# Patient Record
Sex: Female | Born: 1955
Health system: Southern US, Community
[De-identification: ages and names within clinical notes are randomized; demographics above are authoritative.]

## PROBLEM LIST (undated history)

## (undated) DIAGNOSIS — K219 Gastro-esophageal reflux disease without esophagitis: Secondary | ICD-10-CM

## (undated) DIAGNOSIS — S83209A Unspecified tear of unspecified meniscus, current injury, unspecified knee, initial encounter: Secondary | ICD-10-CM

## (undated) DIAGNOSIS — E785 Hyperlipidemia, unspecified: Secondary | ICD-10-CM

## (undated) DIAGNOSIS — M25569 Pain in unspecified knee: Secondary | ICD-10-CM

## (undated) DIAGNOSIS — R739 Hyperglycemia, unspecified: Secondary | ICD-10-CM

## (undated) DIAGNOSIS — E559 Vitamin D deficiency, unspecified: Secondary | ICD-10-CM

## (undated) DIAGNOSIS — I129 Hypertensive chronic kidney disease with stage 1 through stage 4 chronic kidney disease, or unspecified chronic kidney disease: Secondary | ICD-10-CM

## (undated) DIAGNOSIS — D219 Benign neoplasm of connective and other soft tissue, unspecified: Secondary | ICD-10-CM

## (undated) DIAGNOSIS — E7800 Pure hypercholesterolemia, unspecified: Secondary | ICD-10-CM

## (undated) DIAGNOSIS — D509 Iron deficiency anemia, unspecified: Secondary | ICD-10-CM

## (undated) DIAGNOSIS — E1121 Type 2 diabetes mellitus with diabetic nephropathy: Secondary | ICD-10-CM

## (undated) DIAGNOSIS — E2839 Other primary ovarian failure: Secondary | ICD-10-CM

## (undated) DIAGNOSIS — N181 Chronic kidney disease, stage 1: Secondary | ICD-10-CM

## (undated) DIAGNOSIS — H8110 Benign paroxysmal vertigo, unspecified ear: Secondary | ICD-10-CM

## (undated) HISTORY — PX: HERNIA REPAIR: SHX51

## (undated) HISTORY — DX: Hyperglycemia, unspecified: R73.9

## (undated) HISTORY — DX: Type 2 diabetes mellitus with diabetic nephropathy: E11.21

## (undated) HISTORY — DX: Benign neoplasm of connective and other soft tissue, unspecified: D21.9

## (undated) HISTORY — DX: Hyperlipidemia, unspecified: E78.5

## (undated) HISTORY — DX: Chronic kidney disease, stage 1: N18.1

## (undated) HISTORY — DX: Other primary ovarian failure: E28.39

## (undated) HISTORY — DX: Benign paroxysmal vertigo, unspecified ear: H81.10

## (undated) HISTORY — DX: Iron deficiency anemia, unspecified: D50.9

## (undated) HISTORY — DX: Vitamin D deficiency, unspecified: E55.9

## (undated) HISTORY — DX: Pure hypercholesterolemia, unspecified: E78.00

## (undated) HISTORY — DX: Unspecified tear of unspecified meniscus, current injury, unspecified knee, initial encounter: S83.209A

## (undated) HISTORY — DX: Gastro-esophageal reflux disease without esophagitis: K21.9

## (undated) HISTORY — DX: Hypertensive chronic kidney disease with stage 1 through stage 4 chronic kidney disease, or unspecified chronic kidney disease: I12.9

---

## 1998-07-03 ENCOUNTER — Emergency Department (HOSPITAL_COMMUNITY): Admission: EM | Admit: 1998-07-03 | Discharge: 1998-07-03 | Payer: Self-pay | Admitting: Emergency Medicine

## 1998-07-29 ENCOUNTER — Ambulatory Visit (HOSPITAL_COMMUNITY): Admission: RE | Admit: 1998-07-29 | Discharge: 1998-07-29 | Payer: Self-pay | Admitting: Family Medicine

## 1999-11-10 ENCOUNTER — Encounter: Payer: Self-pay | Admitting: Family Medicine

## 1999-11-10 ENCOUNTER — Encounter: Admission: RE | Admit: 1999-11-10 | Discharge: 1999-11-10 | Payer: Self-pay | Admitting: Family Medicine

## 2000-02-08 ENCOUNTER — Emergency Department (HOSPITAL_COMMUNITY): Admission: EM | Admit: 2000-02-08 | Discharge: 2000-02-09 | Payer: Self-pay | Admitting: Emergency Medicine

## 2000-02-08 ENCOUNTER — Encounter: Payer: Self-pay | Admitting: Emergency Medicine

## 2000-12-05 ENCOUNTER — Other Ambulatory Visit: Admission: RE | Admit: 2000-12-05 | Discharge: 2000-12-05 | Payer: Self-pay | Admitting: Obstetrics & Gynecology

## 2002-07-15 ENCOUNTER — Encounter: Payer: Self-pay | Admitting: Family Medicine

## 2002-07-15 ENCOUNTER — Ambulatory Visit (HOSPITAL_COMMUNITY): Admission: RE | Admit: 2002-07-15 | Discharge: 2002-07-15 | Payer: Self-pay | Admitting: Family Medicine

## 2002-09-30 ENCOUNTER — Encounter: Payer: Self-pay | Admitting: General Surgery

## 2002-10-02 ENCOUNTER — Ambulatory Visit (HOSPITAL_COMMUNITY): Admission: RE | Admit: 2002-10-02 | Discharge: 2002-10-04 | Payer: Self-pay | Admitting: General Surgery

## 2003-05-17 ENCOUNTER — Other Ambulatory Visit: Admission: RE | Admit: 2003-05-17 | Discharge: 2003-05-17 | Payer: Self-pay | Admitting: Family Medicine

## 2005-05-17 ENCOUNTER — Other Ambulatory Visit: Admission: RE | Admit: 2005-05-17 | Discharge: 2005-05-17 | Payer: Self-pay | Admitting: Obstetrics and Gynecology

## 2006-04-16 ENCOUNTER — Inpatient Hospital Stay (HOSPITAL_COMMUNITY): Admission: EM | Admit: 2006-04-16 | Discharge: 2006-04-17 | Payer: Self-pay | Admitting: Emergency Medicine

## 2007-03-03 ENCOUNTER — Other Ambulatory Visit: Admission: RE | Admit: 2007-03-03 | Discharge: 2007-03-03 | Payer: Self-pay | Admitting: Obstetrics and Gynecology

## 2008-08-20 ENCOUNTER — Encounter: Admission: RE | Admit: 2008-08-20 | Discharge: 2008-08-20 | Payer: Self-pay | Admitting: Family Medicine

## 2009-09-12 ENCOUNTER — Encounter: Admission: RE | Admit: 2009-09-12 | Discharge: 2009-09-12 | Payer: Self-pay | Admitting: Family Medicine

## 2009-09-12 ENCOUNTER — Other Ambulatory Visit: Admission: RE | Admit: 2009-09-12 | Discharge: 2009-09-12 | Payer: Self-pay | Admitting: Family Medicine

## 2010-05-28 ENCOUNTER — Encounter: Payer: Self-pay | Admitting: Family Medicine

## 2010-08-25 ENCOUNTER — Ambulatory Visit
Admission: RE | Admit: 2010-08-25 | Discharge: 2010-08-25 | Disposition: A | Discharge status: Home / Self Care | Payer: Self-pay | Source: Ambulatory Visit | Attending: Family Medicine | Admitting: Family Medicine

## 2010-08-25 ENCOUNTER — Other Ambulatory Visit: Payer: Self-pay | Admitting: Family Medicine

## 2010-08-25 DIAGNOSIS — M542 Cervicalgia: Secondary | ICD-10-CM

## 2010-09-22 NOTE — Op Note (Signed)
NAME:  Margaret Wong, Margaret Wong                       ACCOUNT NO.:  [id number]   MEDICAL RECORD NO.:  [id number]                   PATIENT TYPE:  OIB   LOCATION:  5735                                 FACILITY:  [facility]   PHYSICIAN:  Jimmye Norman III, M.D.               DATE OF BIRTH:  06/24/1955   DATE OF PROCEDURE:  10/02/2002  DATE OF DISCHARGE:                                 OPERATIVE REPORT   PREOPERATIVE DIAGNOSIS:  Paraumbilical ventral hernia.   POSTOPERATIVE DIAGNOSIS:  Incisional ventral hernia.   PROCEDURE:  Laparoscopic ventral hernia repair with dual mesh measuring 15 x  19 cm in size.   SURGEON:  Jimmye Norman, M.D.   ASSISTANT:  Sharlet Salina T. Hoxworth, M.D.   ANESTHESIA:  General endotracheal.   ESTIMATED BLOOD LOSS:  Less than 50 cc.   COMPLICATIONS:  None.   CONDITION:  Stable.   INDICATIONS FOR OPERATION:  The patient is a 55 year old with lower  abdominal umbilical hernia, apparent ventral hernia repair before, and now  she has recurrence.  No other history.  Now comes in with a ventral hernia.   FINDINGS:  The patient had omentum caught up in a ventral hernia along the  anterior abdominal wall and in the lower midline.  There was no bowel injury  or incarceration in the hernia.   OPERATION:  The patient was taken to the operating room and placed on the  table in the supine position.  After an adequate endotracheal anesthetic was  administered, she was prepped and draped in the usual sterile manner  exposing the entire abdomen from the costal margins down to the pubic crest.   The initial Hasson cannula was placed in the left lower quadrant of the  patient's abdomen away from the midline incision.  The umbilical fascia was  accessed using appendical retractors, incised, and then the subsequently  incised, allowing access into the peritoneal cavity with the cannula.  It  was secured in place with a pursestring suture of 0 Vicryl.  We insufflated  through this  cannula into the peritoneal cavity up until a maximal intra-  abdominal pressure of 15 mmHg.  We subsequently passed a 0-degree 10-mm  scope through the left lower quadrant cannula inspecting the area.  There  were adhesions close to the cannula itself and along the midline.  Therefore, we placed a right upper quadrant 5-mm cannula which was used to  access the abdomen and also a right lower quadrant 5-mm cannula away from  the midline.  We used a 5-mm scope in order to dissect out the omentum and  the adhesions to the anterior abdominal wall from the right side of the  patient's abdomen.  We used the scope through the right lower quadrant 5-mm  cannula with the laparoscope in that area.  We were able to dissect off the  omentum from the ventral hernia itself and could completely detach its  attachment from the anterior abdominal wall using electrocautery, scissors,  and also blunt dissection.   Once we completely detached the omentum from the anterior abdominal wall, we  measured the edges for our mesh.  We went about 1 cm to 2 cm away from the  edge of the hernia defect and with localizing needles were able to mark the  spots for the mesh.  The measurements were approximately 15 x 15 cm in size  and, therefore, we used a 15 x 19-cm piece of dual mesh with the rough side  being up against the patient's abdominal wall.   We measured this, and then subsequently placed sutures of 0 Novofil in six  equally spaced places along the edge, passing it through the 10-mm cannula  into the peritoneal cavity, and opened it up in order to allow the sutures  to be seen.  The rough side again was up against the fascia and the  abdominal wall with the smooth side being against the peritoneal cavity  contents.  We marked the sites for the sutures to be brought through the  anterior abdominal wall.  We used a suture grasper which was brought out in  the six sites for removal.  We subsequently did that,  pulled up on the mesh,  secured it in place with suture tying down.  One of the lateral sutures did  break and subsequently fall away from the abdominal wall, but this was after  we had attached 75% of more of the mesh using a tacker to the anterior  abdominal wall.  We subsequently tacked the area where the suture had broken  on the right lateral aspect and secured it in place with these tacking  staplers.   We circumferentially tacked down the mesh, and it had good appositions at  the anterior abdominal wall.  We then allowed all gas to escape, and we  closed.   The site of the initial cannula was closed using the pursestring suture.  We  also added a figure-of-eight stitch of 0 Vicryl on the corner using a UR-6  needle.  We closed this area using a running subcuticular stitch of 4-0  Vicryl.  The rest of the sites were closed using Steri-Strips.  All needle  counts, sponge counts, and instrument counts were correct at the  conclusion  of the case.                                               Kathrin Ruddy, M.D.    JW/MEDQ  D:  10/02/2002  T:  10/03/2002  Job:  [id number]

## 2010-09-29 ENCOUNTER — Other Ambulatory Visit: Payer: Self-pay | Admitting: Obstetrics and Gynecology

## 2010-09-29 ENCOUNTER — Other Ambulatory Visit (HOSPITAL_COMMUNITY)
Admission: RE | Admit: 2010-09-29 | Discharge: 2010-09-29 | Disposition: A | Discharge status: Home / Self Care | Payer: Managed Care, Other (non HMO) | Source: Ambulatory Visit | Attending: Obstetrics and Gynecology | Admitting: Obstetrics and Gynecology

## 2010-09-29 DIAGNOSIS — Z01419 Encounter for gynecological examination (general) (routine) without abnormal findings: Secondary | ICD-10-CM | POA: Insufficient documentation

## 2010-11-28 ENCOUNTER — Ambulatory Visit (HOSPITAL_COMMUNITY)
Admission: RE | Admit: 2010-11-28 | Discharge: 2010-11-28 | Disposition: A | Discharge status: Home / Self Care | Payer: Managed Care, Other (non HMO) | Source: Ambulatory Visit | Attending: Sports Medicine | Admitting: Sports Medicine

## 2010-11-28 DIAGNOSIS — M79609 Pain in unspecified limb: Secondary | ICD-10-CM | POA: Insufficient documentation

## 2011-05-03 DIAGNOSIS — H9209 Otalgia, unspecified ear: Secondary | ICD-10-CM | POA: Insufficient documentation

## 2011-05-03 NOTE — ED Notes (Signed)
PT c/o L ear pain onset around noon.  Pt states she can still hear but it is muffled.  Pt states pain has increased over past few hours.  Pt hasn't taken any meds.

## 2011-05-04 ENCOUNTER — Emergency Department (HOSPITAL_BASED_OUTPATIENT_CLINIC_OR_DEPARTMENT_OTHER)
Admission: EM | Admit: 2011-05-04 | Discharge: 2011-05-04 | Discharge status: Against Medical Advice | Payer: Managed Care, Other (non HMO) | Attending: Emergency Medicine | Admitting: Emergency Medicine

## 2011-05-04 HISTORY — DX: Pain in unspecified knee: M25.569

## 2011-07-10 ENCOUNTER — Other Ambulatory Visit: Payer: Self-pay | Admitting: Family Medicine

## 2011-07-13 ENCOUNTER — Other Ambulatory Visit: Payer: Managed Care, Other (non HMO)

## 2011-07-16 ENCOUNTER — Ambulatory Visit
Admission: RE | Admit: 2011-07-16 | Discharge: 2011-07-16 | Disposition: A | Discharge status: Home / Self Care | Payer: Managed Care, Other (non HMO) | Source: Ambulatory Visit | Attending: Family Medicine | Admitting: Family Medicine

## 2011-07-16 MED ORDER — IOHEXOL 300 MG/ML  SOLN
100.0000 mL | Freq: Once | INTRAMUSCULAR | Status: AC | PRN
  Administered 2011-07-16: 100 mL via INTRAVENOUS

## 2012-02-15 ENCOUNTER — Other Ambulatory Visit: Payer: Self-pay | Admitting: Obstetrics and Gynecology

## 2012-02-15 ENCOUNTER — Other Ambulatory Visit (HOSPITAL_COMMUNITY)
Admission: RE | Admit: 2012-02-15 | Discharge: 2012-02-15 | Disposition: A | Discharge status: Home / Self Care | Payer: Managed Care, Other (non HMO) | Source: Ambulatory Visit | Attending: Obstetrics and Gynecology | Admitting: Obstetrics and Gynecology

## 2012-02-15 DIAGNOSIS — Z01419 Encounter for gynecological examination (general) (routine) without abnormal findings: Secondary | ICD-10-CM | POA: Insufficient documentation

## 2013-06-03 ENCOUNTER — Ambulatory Visit: Payer: Managed Care, Other (non HMO)

## 2013-06-10 ENCOUNTER — Encounter: Payer: Managed Care, Other (non HMO) | Attending: Family Medicine

## 2013-07-10 ENCOUNTER — Other Ambulatory Visit: Payer: Self-pay | Admitting: Family Medicine

## 2013-07-10 DIAGNOSIS — R1032 Left lower quadrant pain: Secondary | ICD-10-CM

## 2013-07-17 ENCOUNTER — Ambulatory Visit
Admission: RE | Admit: 2013-07-17 | Discharge: 2013-07-17 | Disposition: A | Discharge status: Home / Self Care | Payer: Managed Care, Other (non HMO) | Source: Ambulatory Visit | Attending: Family Medicine | Admitting: Family Medicine

## 2013-07-17 DIAGNOSIS — R1032 Left lower quadrant pain: Secondary | ICD-10-CM

## 2013-07-17 MED ORDER — IOHEXOL 300 MG/ML  SOLN
100.0000 mL | Freq: Once | INTRAMUSCULAR | Status: AC | PRN
Start: 2013-07-17 — End: 2013-07-17
  Administered 2013-07-17: 100 mL via INTRAVENOUS

## 2015-03-18 ENCOUNTER — Encounter: Payer: Self-pay | Admitting: *Deleted

## 2015-03-18 ENCOUNTER — Encounter: Payer: Self-pay | Admitting: Internal Medicine

## 2015-03-18 ENCOUNTER — Ambulatory Visit (INDEPENDENT_AMBULATORY_CARE_PROVIDER_SITE_OTHER): Payer: Managed Care, Other (non HMO) | Admitting: Internal Medicine

## 2015-03-18 VITALS — BP 122/76 | HR 80 | Ht 62.0 in | Wt 169.1 lb

## 2015-03-18 DIAGNOSIS — R079 Chest pain, unspecified: Secondary | ICD-10-CM | POA: Diagnosis not present

## 2015-03-18 NOTE — Progress Notes (Signed)
Cardiology Office Note   Date:  03/18/2015   ID:  Margaret Wong, DOB [birth year], MRN [id number]  PCP:  Vidal Schwalbe, MD  Cardiologist:   Dorris Carnes, MD   Chief Complaint  Patient presents with  . New Patient (Initial Visit)  . Chest Pain      History of Present Illness: Margaret Wong is a 59 y.o. female with a history of  CP   She was referred by Dr Dema Severin  No known history of CAD   She says she has episodes of CP that occur at night when lying in bed.  SOme SOB She also notes some chest pressure with walking at work  SOme SOB with exertion.  Does not exercise or walk outside of work Does note some problems with food getting stuck  Some reflex.         Current Outpatient Prescriptions  Medication Sig Dispense Refill  . atorvastatin (LIPITOR) 20 MG tablet Take 20 mg by mouth daily.  12  . ETODOLAC PO Take 1 tablet by mouth daily as needed. For pain     . losartan (COZAAR) 50 MG tablet Take 50 mg by mouth daily.  5  . meclizine (ANTIVERT) 25 MG tablet Take 25 mg by mouth daily.  5  . meloxicam (MOBIC) 15 MG tablet Take 15 mg by mouth daily.  5  . metFORMIN (GLUCOPHAGE-XR) 500 MG 24 hr tablet Take 500 mg by mouth daily.  5  . omeprazole (PRILOSEC) 40 MG capsule Take 40 mg by mouth daily.  5   No current facility-administered medications for this visit.    Allergies:   Amoxicillin   Past Medical History  Diagnosis Date  . Knee joint pain     Past Surgical History  Procedure Laterality Date  . Hernia repair       Social History:  The patient  reports that she has never smoked. She does not have any smokeless tobacco history on file. She reports that she does not drink alcohol or use illicit drugs.   Family History:  The patient's family history is not on file.    ROS:  Please see the history of present illness. All other systems are reviewed and  Negative to the above problem except as noted.    PHYSICAL EXAM: VS:  BP 122/76 mmHg  Pulse 80  Ht 5'  2" (1.575 m)  Wt 76.712 kg (169 lb 1.9 oz)  BMI 30.92 kg/m2  GEN: Obese 59 yo in no acute distress HEENT: normal Neck: no JVD, carotid bruits, or masses Cardiac: RRR; no murmurs, rubs, or gallops,no edema  Respiratory:  clear to auscultation bilaterally, normal work of breathing GI: soft, nontender, nondistended, + BS  No hepatomegaly  MS: no deformity Moving all extremities   Skin: warm and dry, no rash Neuro:  Strength and sensation are intact Psych: euthymic mood, full affect   EKG:  EKG is  ordered today.  SR 80 bpm     Lipid Panel No results found for: CHOL, TRIG, HDL, CHOLHDL, VLDL, LDLCALC, LDLDIRECT    Wt Readings from Last 3 Encounters:  03/18/15 76.712 kg (169 lb 1.9 oz)  05/03/11 74.844 kg (165 lb)      ASSESSMENT AND PLAN:  1  CP  SOme is atypical for cardiac  Sounds more GI in origin (spells in bed, difficulty swallowing food)  I do think she needs to be seen in GI  I would also set her upfor stress myvoview to evaluate  for ischemia  Consider echo if myoview normal to evaluate diastolic funciton.  2  HL  Keep on lipitor  Will need to be followed.     Signed, Dorris Carnes, MD  03/18/2015 3:12 PM    [address] [place], [place],   [address] Phone: [phone]; Fax: [phone]

## 2015-03-18 NOTE — Patient Instructions (Signed)
Medication Instructions:   NO CHANGE  Testing/Procedures:  Your physician has requested that you have en exercise stress myoview. For further information please visit [email or web address]. Please follow instruction sheet, as given.    Follow-Up:  Your physician recommends that you schedule a follow-up appointment in: AS NEEDED PENDING TEST RESULTS   If you need a refill on your cardiac medications before your next appointment, please call your pharmacy.

## 2015-04-13 ENCOUNTER — Telehealth (HOSPITAL_COMMUNITY): Payer: Self-pay

## 2015-04-13 NOTE — Telephone Encounter (Signed)
Left message on voicemail in reference to upcoming appointment scheduled for 04-15-2015. Phone number given for a call back so details instructions can be given. Irven Baltimore A,RN

## 2015-04-15 ENCOUNTER — Encounter (HOSPITAL_COMMUNITY): Payer: Managed Care, Other (non HMO)

## 2015-05-04 ENCOUNTER — Telehealth (HOSPITAL_COMMUNITY): Payer: Self-pay | Admitting: *Deleted

## 2015-05-04 NOTE — Telephone Encounter (Signed)
Left message on voicemail in reference to upcoming appointment scheduled for 05/06/15. Phone number given for a call back so details instructions can be given. Meloni Hinz, Ranae Palms

## 2015-05-06 ENCOUNTER — Ambulatory Visit (HOSPITAL_COMMUNITY): Payer: Managed Care, Other (non HMO)

## 2017-06-14 ENCOUNTER — Ambulatory Visit
Admission: RE | Admit: 2017-06-14 | Discharge: 2017-06-14 | Disposition: A | Discharge status: Home / Self Care | Payer: 59 | Source: Ambulatory Visit | Attending: Family Medicine | Admitting: Family Medicine

## 2017-06-14 ENCOUNTER — Other Ambulatory Visit: Payer: Self-pay | Admitting: Family Medicine

## 2017-06-14 DIAGNOSIS — R059 Cough, unspecified: Secondary | ICD-10-CM

## 2017-06-14 DIAGNOSIS — R05 Cough: Secondary | ICD-10-CM

## 2017-09-13 ENCOUNTER — Other Ambulatory Visit (HOSPITAL_COMMUNITY)
Admission: RE | Admit: 2017-09-13 | Discharge: 2017-09-13 | Disposition: A | Discharge status: Home / Self Care | Payer: Managed Care, Other (non HMO) | Source: Ambulatory Visit | Attending: Family Medicine | Admitting: Family Medicine

## 2017-09-13 ENCOUNTER — Other Ambulatory Visit: Payer: Self-pay | Admitting: Family Medicine

## 2017-09-13 DIAGNOSIS — Z124 Encounter for screening for malignant neoplasm of cervix: Secondary | ICD-10-CM | POA: Diagnosis present

## 2017-09-17 LAB — CYTOLOGY - PAP
Diagnosis: NEGATIVE
HPV: NOT DETECTED

## 2017-09-25 ENCOUNTER — Telehealth: Payer: Self-pay

## 2017-09-25 NOTE — Telephone Encounter (Signed)
SENT REFERRAL TO SCHEDULING 

## 2017-09-26 ENCOUNTER — Other Ambulatory Visit: Payer: Self-pay | Admitting: Family Medicine

## 2017-09-26 DIAGNOSIS — E2839 Other primary ovarian failure: Secondary | ICD-10-CM

## 2017-11-22 ENCOUNTER — Other Ambulatory Visit: Payer: Managed Care, Other (non HMO)

## 2018-01-31 ENCOUNTER — Other Ambulatory Visit: Payer: Managed Care, Other (non HMO)

## 2018-02-07 ENCOUNTER — Other Ambulatory Visit: Payer: Managed Care, Other (non HMO)

## 2018-04-07 ENCOUNTER — Other Ambulatory Visit: Payer: Managed Care, Other (non HMO)

## 2018-04-21 ENCOUNTER — Ambulatory Visit: Payer: Managed Care, Other (non HMO) | Admitting: Internal Medicine

## 2018-12-09 ENCOUNTER — Other Ambulatory Visit: Payer: Self-pay | Admitting: Family Medicine

## 2018-12-09 DIAGNOSIS — R5381 Other malaise: Secondary | ICD-10-CM

## 2019-03-06 ENCOUNTER — Other Ambulatory Visit: Payer: Self-pay

## 2019-03-06 ENCOUNTER — Encounter: Payer: Self-pay | Admitting: Internal Medicine

## 2019-03-06 ENCOUNTER — Ambulatory Visit (INDEPENDENT_AMBULATORY_CARE_PROVIDER_SITE_OTHER): Payer: 59 | Admitting: Internal Medicine

## 2019-03-06 VITALS — BP 140/82 | HR 75 | Ht 62.0 in | Wt 155.0 lb

## 2019-03-06 DIAGNOSIS — R0789 Other chest pain: Secondary | ICD-10-CM

## 2019-03-06 DIAGNOSIS — R0602 Shortness of breath: Secondary | ICD-10-CM

## 2019-03-06 DIAGNOSIS — R5383 Other fatigue: Secondary | ICD-10-CM

## 2019-03-06 MED ORDER — METOPROLOL TARTRATE 100 MG PO TABS: ORAL_TABLET | ORAL | 0 refills | Status: AC

## 2019-03-06 NOTE — Progress Notes (Signed)
Cardiology Office Note   Date:  03/06/2019   ID:  Margaret Wong, DOB [birth year], MRN [id number]  PCP:  Lois Huxley, PA  Cardiologist:   Dorris Carnes, MD   PT referred by PCP for eval of CP      History of Present Illness: Margaret Wong is a 63 y.o. female with a history of HL, HTN, DM   She is followed by Dr Dema Severin in IM   Saw Dr white in September     I saw the pt in 2016   [facility] had complaints of CP   More with laying in bed  The pt complains of CP that is worse when laying on one side.     She also notes however a tightness in chest with activity   ALso increased fatigue with activity   INcreased SOB with activity  Pt started back on Losartan and her symptoms have improved some   Still there  Pt anxious   Husband died in past year        Current Meds  Medication Sig   atorvastatin (LIPITOR) 20 MG tablet Take 20 mg by mouth daily.   Cholecalciferol (VITAMIN D3) 1.25 MG (50000 UT) CAPS Take by mouth.   ETODOLAC PO Take 1 tablet by mouth daily as needed. For pain    losartan (COZAAR) 50 MG tablet Take 50 mg by mouth daily.   meclizine (ANTIVERT) 25 MG tablet Take 25 mg by mouth daily.   metFORMIN (GLUCOPHAGE-XR) 500 MG 24 hr tablet Take 500 mg by mouth daily.   omeprazole (PRILOSEC) 40 MG capsule Take 40 mg by mouth daily.     Allergies:   Amoxicillin   Past Medical History:  Diagnosis Date   Acute torn meniscus of knee    BPV (benign positional vertigo)    CKD (chronic kidney disease) stage 1, GFR 90 ml/min or greater    Diabetes mellitus with nephropathy (HCC)    Dyslipidemia    Estrogen deficiency    Fibroids    GERD (gastroesophageal reflux disease)    Hypercholesterolemia    Hyperglycemia    Hypertension with renal disease    Iron deficiency anemia    Knee joint pain    Vitamin D deficiency     Past Surgical History:  Procedure Laterality Date   HERNIA REPAIR       Social History:  The patient  reports that she has never  smoked. She has never used smokeless tobacco. She reports that she does not drink alcohol or use drugs.   Family History:  The patient's family history includes Arthritis in her mother; Coronary artery disease in her father; Diabetes in her mother; Heart attack in her father; Hypertension in her father; Lung disease in her mother.    ROS:  Please see the history of present illness. All other systems are reviewed and  Negative to the above problem except as noted.    PHYSICAL EXAM: VS:  BP 140/82    Pulse 75    Ht 5\' 2"  (1.575 m)    Wt 155 lb (70.3 kg)    BMI 28.35 kg/m   GEN: Overweight 63 yo  in no acute distress  HEENT: normal  Neck: no JVD, carotid bruits, or masses Cardiac: RRR; no murmurs, rubs, or gallops,no edema  Respiratory:  clear to auscultation bilaterally, normal work of breathing GI: soft, nontender, nondistended, + BS  No hepatomegaly  MS: no deformity Moving all extremities   Skin: warm and  dry, no rash Neuro:  Strength and sensation are intact Psych: euthymic mood, full affect   EKG:  EKG is ordered today.  SR 74bpm     Lipid Panel No results found for: CHOL, TRIG, HDL, CHOLHDL, VLDL, LDLCALC, LDLDIRECT    Wt Readings from Last 3 Encounters:  03/06/19 155 lb (70.3 kg)  03/18/15 169 lb 1.9 oz (76.7 kg)  05/03/11 165 lb (74.8 kg)      ASSESSMENT AND PLAN:  1  Chest dyscomfort/  Pt's symptoms are confusing  She  Has some atypical L sided CP   But she has fatigue, tightness SOB  That is more concerning     I would reocmm coronary CT angiogram to evaluate   2  HL  BP is OK   WIll need to be followed  3   HL   Keep on lipitor   F/U based on test results   Current medicines are reviewed at length with the patient today.  The patient does not have concerns regarding medicines.  Signed, Dorris Carnes, MD  03/06/2019 2:16 PM    [facility] [place], [place],   [address] Phone: [phone]; Fax: [phone]

## 2019-03-06 NOTE — Patient Instructions (Addendum)
Medication Instructions:  No changes today *If you need a refill on your cardiac medications before your next appointment, please call your pharmacy*  Lab Work: Today: BMET If you have labs (blood work) drawn today and your tests are completely normal, you will receive your results only by: Marland Kitchen MyChart Message (if you have MyChart) OR . A paper copy in the mail If you have any lab test that is abnormal or we need to change your treatment, we will call you to review the results.  Testing/Procedures: Your physician has requested that you have cardiac CT. Cardiac computed tomography (CT) is a painless test that uses an x-ray machine to take clear, detailed pictures of your heart. For further information please visit HugeFiesta.[email or web address]. Please follow instruction sheet as given.    Follow-Up: At [facility], you and your health needs are our priority.  As part of our continuing mission to provide you with exceptional heart care, we have created designated Provider Care Teams.  These Care Teams include your primary Cardiologist (physician) and Advanced Practice Providers (APPs -  Physician Assistants and Nurse Practitioners) who all work together to provide you with the care you need, when you need it.  Your next appointment:   Follow up with your physician will depend on test results.   Other Instructions Your cardiac CT will be scheduled at one of the below locations:   [facility] [address] [place], [place] [address] [phone]  [facility] [address] [place], [place] [address] [phone]  If scheduled at [facility], please arrive at the [facility] main entrance of [facility] 30-45 minutes prior to test start time. Proceed to the [facility] Radiology Department (first floor) to check-in and test prep.  If scheduled at [facility], please arrive 15 mins early  for check-in and test prep.  Please follow these instructions carefully (unless otherwise directed):  On the Night Before the Test: . Be sure to Drink plenty of water. . Do not consume any caffeinated/decaffeinated beverages or chocolate 12 hours prior to your test. Do not take any antihistamines 12 hours prior to your test.  On the Day of the Test: . Drink plenty of water. Do not drink any water within one hour of the test. . Do not eat any food 4 hours prior to the test. . You may take your regular medications prior to the test.  . Take metoprolol (Lopressor) two hours prior to test. . FEMALES- please wear underwire-free bra if available      After the Test: . Drink plenty of water. . After receiving IV contrast, you may experience a mild flushed feeling. This is normal. . On occasion, you may experience a mild rash up to 24 hours after the test. This is not dangerous. If this occurs, you can take Benadryl 25 mg and increase your fluid intake. . If you experience trouble breathing, this can be serious. If it is severe call 911 IMMEDIATELY. If it is mild, please call our office. . If you take any of these medications: Glipizide/Metformin, Avandament, Glucavance, please do not take 48 hours after completing test unless otherwise instructed.   Once we have confirmed authorization from your insurance company, we will call you to set up a date and time for your test.   For non-scheduling related questions, please contact the cardiac imaging nurse navigator should you have any questions/concerns: Marchia Bond, RN Navigator Cardiac  Imaging [facility] [phone] Office

## 2019-03-07 ENCOUNTER — Other Ambulatory Visit: Payer: Self-pay | Admitting: Internal Medicine

## 2019-03-07 LAB — BASIC METABOLIC PANEL
BUN/Creatinine Ratio: 17 (ref 12–28)
BUN: 16 mg/dL (ref 8–27)
CO2: 21 mmol/L (ref 20–29)
Calcium: 9.6 mg/dL (ref 8.7–10.3)
Chloride: 107 mmol/L — ABNORMAL HIGH (ref 96–106)
Creatinine, Ser: 0.92 mg/dL (ref 0.57–1.00)
GFR calc Af Amer: 77 mL/min/{1.73_m2} (ref >59)
GFR calc non Af Amer: 66 mL/min/{1.73_m2} (ref >59)
Glucose: 130 mg/dL — ABNORMAL HIGH (ref 65–99)
Potassium: 5.4 mmol/L — ABNORMAL HIGH (ref 3.5–5.2)
Sodium: 142 mmol/L (ref 134–144)

## 2019-03-08 ENCOUNTER — Other Ambulatory Visit: Payer: Self-pay | Admitting: Internal Medicine

## 2019-03-09 ENCOUNTER — Telehealth: Payer: Self-pay | Admitting: *Deleted

## 2019-03-09 NOTE — Telephone Encounter (Signed)
-----   Message from Dorris Carnes V, MD sent at 03/07/2019  4:27 PM EDT ----- Potassium is a little high I would recomm switching losartan to Losartan/HCTZ  50/12.5   THis may help  Keep track of BP CHeck BMET in 10 days

## 2019-03-09 NOTE — Telephone Encounter (Signed)
Both numbers listed for pt are not in service.

## 2019-03-10 ENCOUNTER — Telehealth: Payer: Self-pay | Admitting: *Deleted

## 2019-03-10 DIAGNOSIS — E875 Hyperkalemia: Secondary | ICD-10-CM

## 2019-03-10 MED ORDER — LOSARTAN POTASSIUM-HCTZ 50-12.5 MG PO TABS: 1.0000 | ORAL_TABLET | Freq: Every day | ORAL | 3 refills | Status: AC

## 2019-03-10 NOTE — Addendum Note (Signed)
Addended by: Rodman Key on: 03/10/2019 04:45 PM   Modules accepted: Orders

## 2019-03-10 NOTE — Telephone Encounter (Signed)
Notes recorded by Fay Records, MD on 03/07/2019 at 4:27 PM EDT  Potassium is a little high  I would recomm switching losartan to Losartan/HCTZ 50/12.5  THis may help  Keep track of BP  CHeck BMET in 10 days   Patient's daughter returned call.  Updated phone number as patient's primary number being [phone].  Pt will begin medicine sometime this weekend when daughter can pick it up for her.  At that point pt will call back and schedule day to repeat lab work.

## 2019-03-10 NOTE — Telephone Encounter (Signed)
Home and mobile numbers are not in service. Message left on daughter's mobile number to call back.

## 2019-03-23 NOTE — Addendum Note (Signed)
Addended by: Patterson Hammersmith A on: 03/23/2019 12:05 PM   Modules accepted: Orders

## 2019-03-31 ENCOUNTER — Telehealth: Payer: Self-pay | Admitting: *Deleted

## 2019-03-31 DIAGNOSIS — E785 Hyperlipidemia, unspecified: Secondary | ICD-10-CM

## 2019-03-31 MED ORDER — ATORVASTATIN CALCIUM 40 MG PO TABS: 40.0000 mg | ORAL_TABLET | Freq: Every day | ORAL | 3 refills | Status: DC

## 2019-03-31 NOTE — Telephone Encounter (Signed)
-----   Message from Dorris Carnes V, MD sent at 03/26/2019 10:08 AM EST ----- LDL is 83   Pt is taking 20 mg lipitor   I would increase to 40 mg to optimize LDL Watch saturated fats in diet   Limit CHeck lpids in 4 months Make sure pt taking lipitor at night

## 2019-03-31 NOTE — Telephone Encounter (Signed)
Spoke with patient and informed of recommendations from Dr. Harrington Challenger. She will double up on current dose of atorvastatin and knows to pick up new bottle of 40 mg tablets.  She will take one every night.  She will plan to have lipids rechecked on March, unable to schedule appointment today.  She asked about CT appointment. I sent a message to Rhys Martini will contact patient, or her daughter to schedule this.

## 2019-05-15 ENCOUNTER — Telehealth: Payer: Self-pay | Admitting: *Deleted

## 2019-05-15 NOTE — Telephone Encounter (Signed)
Clovis Cao, RN; Rodman Key, RN  Good morning, Patient scheduled for Jan 22,2021 230p , she is aware she needs to have blood work prior to test, she wants to have it done at her PCP.  Left VM for patient that I will mail her the prescription to take to her PCP to have labs prior to CT.  Adv to call back by Monday if she would rather pick it up at office.  Otherwise I will mail to her Monday.

## 2019-05-18 NOTE — Telephone Encounter (Signed)
Prescription for BMET with results to be faxed to Dr. Harrington Challenger at [phone] placed in outgoing mail.

## 2019-05-19 ENCOUNTER — Telehealth: Payer: Self-pay | Admitting: Internal Medicine

## 2019-05-19 NOTE — Telephone Encounter (Signed)
New Message     Pt is calling back, she says she missed a call     Please call

## 2019-05-20 NOTE — Telephone Encounter (Signed)
Left detailed message (self-identified vm) that on Monday I put her prescription for blood work in out going mail and that we need to have results back before her CT on 05/29/19.  Adv to call back if she decides to come to our office for blood work or if she has any other questions.

## 2019-05-28 ENCOUNTER — Telehealth (HOSPITAL_COMMUNITY): Payer: Self-pay | Admitting: Emergency Medicine

## 2019-05-28 NOTE — Telephone Encounter (Signed)
Left message on voicemail with name and callback number Kandice Schmelter RN Navigator Cardiac Imaging [place] [phone] Office [phone] Cell  

## 2019-05-29 ENCOUNTER — Ambulatory Visit (HOSPITAL_COMMUNITY)
Admission: RE | Admit: 2019-05-29 | Discharge: 2019-05-29 | Disposition: A | Discharge status: Home / Self Care | Payer: 59 | Source: Ambulatory Visit | Attending: Internal Medicine | Admitting: Internal Medicine

## 2019-05-29 ENCOUNTER — Encounter (HOSPITAL_COMMUNITY): Payer: Self-pay

## 2019-05-29 ENCOUNTER — Other Ambulatory Visit: Payer: Self-pay

## 2019-05-29 ENCOUNTER — Telehealth: Payer: Self-pay | Admitting: Internal Medicine

## 2019-05-29 DIAGNOSIS — R5383 Other fatigue: Secondary | ICD-10-CM | POA: Diagnosis not present

## 2019-05-29 DIAGNOSIS — R0602 Shortness of breath: Secondary | ICD-10-CM | POA: Diagnosis not present

## 2019-05-29 DIAGNOSIS — R0789 Other chest pain: Secondary | ICD-10-CM | POA: Diagnosis not present

## 2019-05-29 LAB — POCT I-STAT CREATININE: Creatinine, Ser: 0.9 mg/dL (ref 0.44–1.00)

## 2019-05-29 MED ORDER — NITROGLYCERIN 0.4 MG SL SUBL
SUBLINGUAL_TABLET | SUBLINGUAL | Status: AC
  Filled 2019-05-29: qty 2

## 2019-05-29 MED ORDER — NITROGLYCERIN 0.4 MG SL SUBL
0.8000 mg | SUBLINGUAL_TABLET | Freq: Once | SUBLINGUAL | Status: AC
  Administered 2019-05-29: 15:00:00 0.8 mg via SUBLINGUAL

## 2019-05-29 MED ORDER — IOHEXOL 350 MG/ML SOLN
100.0000 mL | Freq: Once | INTRAVENOUS | Status: AC | PRN
  Administered 2019-05-29: 16:00:00 100 mL via INTRAVENOUS

## 2019-05-29 MED ORDER — METOPROLOL TARTRATE 5 MG/5ML IV SOLN
INTRAVENOUS | Status: AC
  Filled 2019-05-29: qty 15

## 2019-05-29 MED ORDER — METOPROLOL TARTRATE 5 MG/5ML IV SOLN
5.0000 mg | INTRAVENOUS | Status: DC | PRN
  Administered 2019-05-29: 5 mg via INTRAVENOUS

## 2019-05-29 NOTE — Telephone Encounter (Signed)
New message:     Patient calling stating that she did not get her blood done. She would like for some one to call her back.

## 2019-05-29 NOTE — Telephone Encounter (Signed)
Patient aware that she will have I stat test today to check kidney function prior to cCT this afternoon.

## 2019-06-02 ENCOUNTER — Telehealth: Payer: Self-pay | Admitting: *Deleted

## 2019-06-02 NOTE — Telephone Encounter (Signed)
-----   Message from Dorris Carnes V, MD sent at 05/31/2019  [id number] PM EST ----- Calicum score is 1   One very small plain LAD    Does not explain symtpoms though but would treat cholesterol aggressively Get lipid panel    Would also set up for echo to evaluatediasolic function/thickness

## 2019-06-08 ENCOUNTER — Telehealth: Payer: Self-pay | Admitting: Internal Medicine

## 2019-06-08 DIAGNOSIS — R0789 Other chest pain: Secondary | ICD-10-CM

## 2019-06-08 DIAGNOSIS — R0602 Shortness of breath: Secondary | ICD-10-CM

## 2019-06-08 DIAGNOSIS — R5383 Other fatigue: Secondary | ICD-10-CM

## 2019-06-08 NOTE — Telephone Encounter (Signed)
Calicum score is 1  One very small plain LAD   Does not explain symtpoms though but would treat cholesterol aggressively Get lipid panel   Would also set up for echo to evaluatediasolic function/thickness  Cardiac CT results given to patient.  She verbalizes understanding and wrote down details so that she can explain to her daughter.  She will speak with her daughter and will call back to schedule echo and fasting lipids.

## 2019-06-08 NOTE — Telephone Encounter (Signed)
New Message  Patient is calling in for CT results. Please give patient a call back to discuss.

## 2019-06-08 NOTE — Telephone Encounter (Signed)
Follow Up:   Pt said please call her result back before 4:00 or after 5:00 today if possible please.

## 2019-06-16 NOTE — Telephone Encounter (Signed)
No return call from patient as of this time. Order for echo placed. Recall letter for patient to call and schedule lipids.

## 2019-07-01 ENCOUNTER — Encounter (HOSPITAL_COMMUNITY): Payer: Self-pay | Admitting: Radiology

## 2019-07-17 ENCOUNTER — Telehealth (HOSPITAL_COMMUNITY): Payer: Self-pay | Admitting: Internal Medicine

## 2019-07-17 NOTE — Telephone Encounter (Signed)
Just an FYI. We have made several attempts to contact this patient including sending a letter to schedule or reschedule their echocardiogram. We will be removing the patient from the echo WQ.   Thank you     2.24.21 will mail letter evd  2.24.21@1043  Called husband # to schedule unable to [facility]  2.9.21@1054  Husband number unable to lm. Called moblile #[email or web address]. evd

## 2020-06-30 ENCOUNTER — Other Ambulatory Visit: Payer: Self-pay | Admitting: Family Medicine

## 2020-07-04 ENCOUNTER — Other Ambulatory Visit: Payer: Self-pay | Admitting: Family Medicine

## 2020-07-04 DIAGNOSIS — R1032 Left lower quadrant pain: Secondary | ICD-10-CM

## 2020-09-20 ENCOUNTER — Other Ambulatory Visit: Payer: 59

## 2020-12-30 ENCOUNTER — Other Ambulatory Visit: Payer: Self-pay | Admitting: Physician Assistant

## 2020-12-30 DIAGNOSIS — R1032 Left lower quadrant pain: Secondary | ICD-10-CM

## 2021-09-14 IMAGING — CT CT HEART MORP W/ CTA COR W/ SCORE W/ CA W/CM &/OR W/O CM
4 of 7 series · 8 of 20 positions shown, 9 images · IV contrast (APPLIED)
Comparison: Chest radiograph 09/12/2009.  No prior CT.
COMPARISON: Chest radiograph 09/12/2009.  No prior CT.
COMPARISON: Chest radiograph 09/12/2009.  No prior CT.

Addendum:
EXAM:
OVER-READ INTERPRETATION  CT CHEST

The following report is an over-read performed by radiologist Dr.
Sonto Suzan [[email or web address]] on 05/29/2019. This over-read
does not include interpretation of cardiac or coronary anatomy or
pathology. The coronary CTA interpretation by the cardiologist is
attached.
CLINICAL DATA: 64 year old with chest pain.
Cardiac/Coronary  CTA
TECHNIQUE: The patient was scanned on a [employer] scanner.

[Series 6: best diast 74 % · axial · 0.39mm/px · z∈[-308,-272]mm · 2 of 271 slices shown, 3 images]
[im 91/271  vessel]
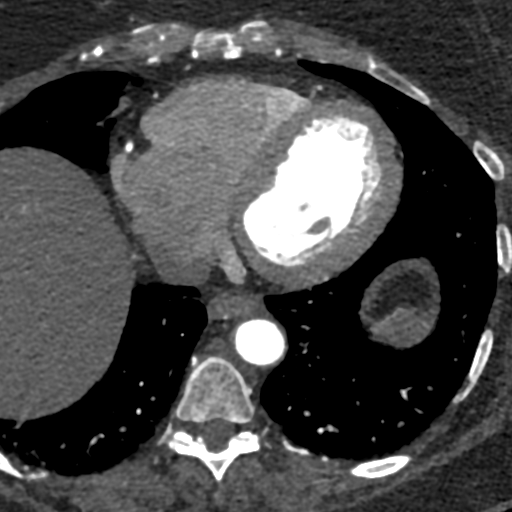
[im 91/271  lung]
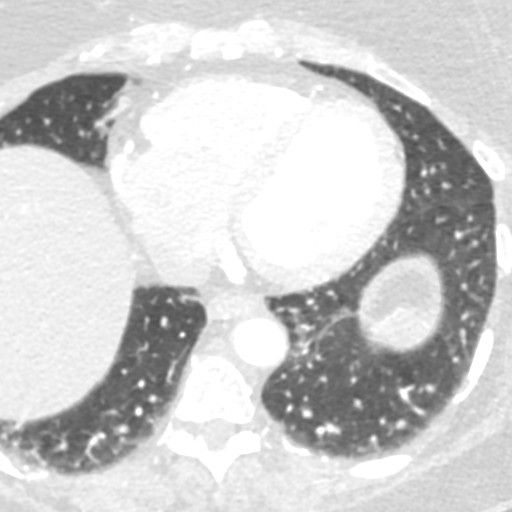
[im 181/271  vessel]
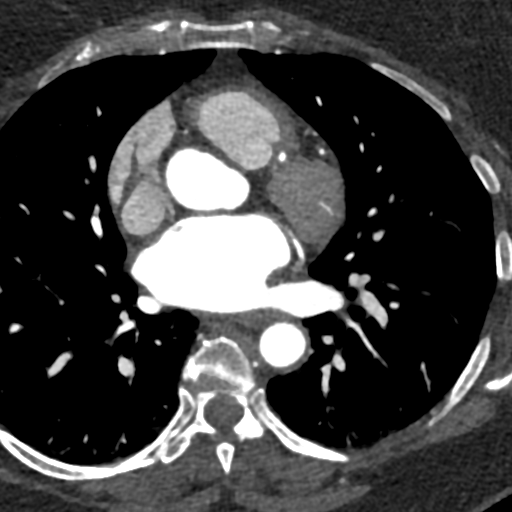

[Series 7: best syst 41 % · axial · 0.39mm/px · z∈[-308,-272]mm · 2 of 271 slices shown]
[im 91/271  vessel]
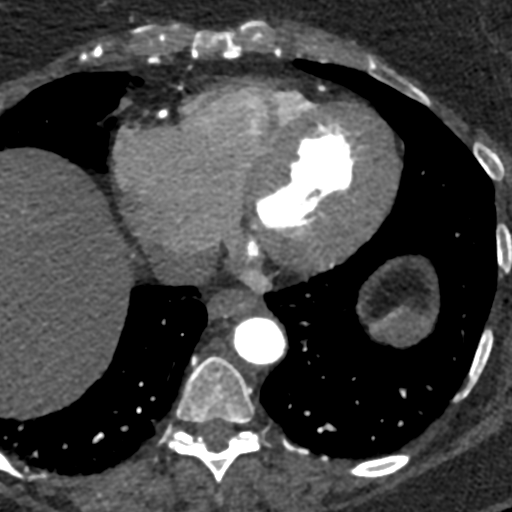
[im 181/271  vessel]
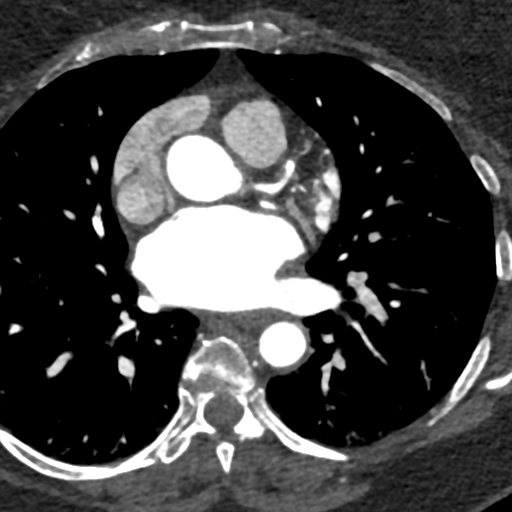

[Series 8: ts diast sharp 41 % · axial · 0.39mm/px · z∈[-308,-272]mm · 2 of 271 slices shown]
[im 91/271  lung]
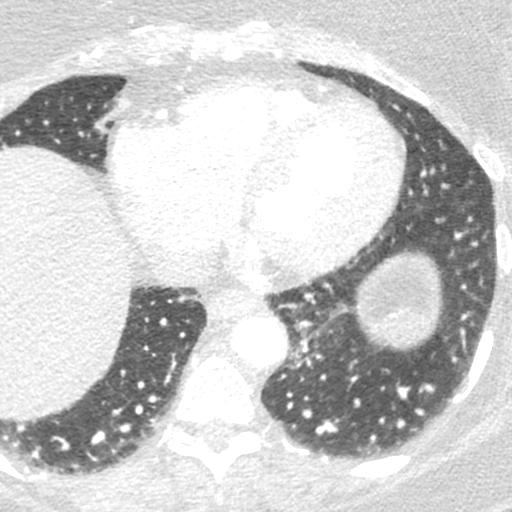
[im 181/271  lung]
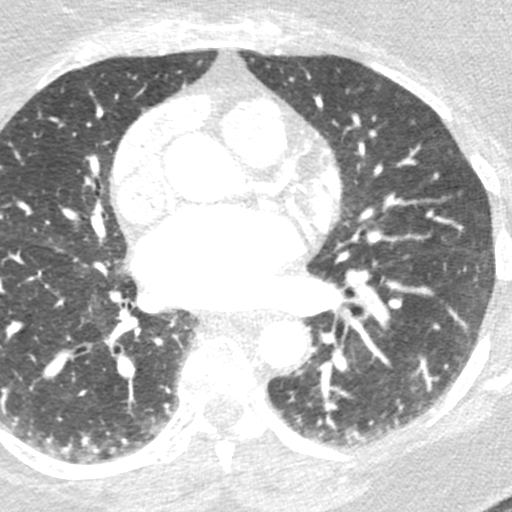

[Series 9: ts syst sharp 41 % · axial · 0.39mm/px · z∈[-308,-272]mm · 2 of 271 slices shown]
[im 91/271  lung]
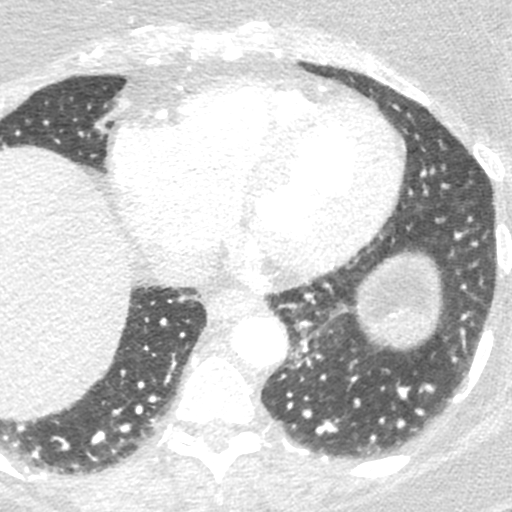
[im 181/271  lung]
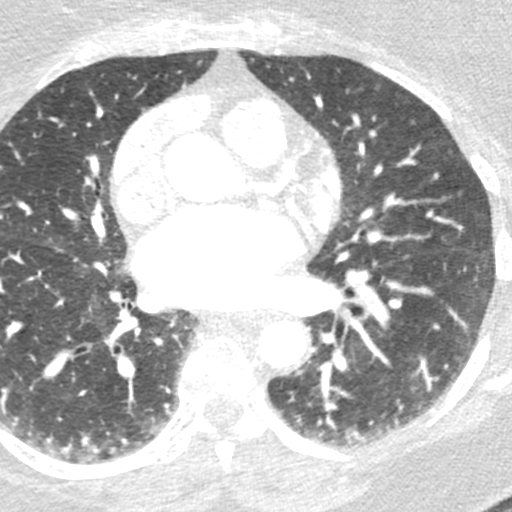

[8 of 20 positions shown; findings below may reference images not displayed]

FINDINGS: Vascular: Aortic atherosclerosis. No dissection or aneurysm. No
central pulmonary embolism, on this non-dedicated study.

Mediastinum/Nodes: A right hilar node is upper normal sized at 9 mm.
No imaged thoracic adenopathy.

Lungs/Pleura: No pleural fluid.  Clear imaged lungs.

Upper Abdomen: Possible mild hepatic steatosis. Normal imaged
portions of the stomach, spleen.

Musculoskeletal: No acute osseous abnormality.
IMPRESSION: 1.  No acute findings in the imaged extracardiac chest.
2.  Aortic Atherosclerosis (729VG-KZ3.3).
FINDINGS: A 100 kV prospective scan was triggered in the descending thoracic
aorta at 111 HU's. Axial non-contrast 3 mm slices were carried out
through the heart. The data set was analyzed on a dedicated work
station and scored using the Agatson method. Gantry rotation speed
was 250 msecs and collimation was .6 mm. 100mg metoprolol beta
blockade and 0.8 mg of sl NTG was given. The 3D data set was
reconstructed in 5% intervals of the 67-82 % of the R-R cycle.
Diastolic phases were analyzed on a dedicated work station using
MPR, MIP and VRT modes. The patient received 80 cc of contrast.

See radiology report for non cardiac findings.

Aorta:  Normal size.  No calcifications.  No dissection.

Aortic Valve: Trileaflet. Very small calcification of aortic valve
annulus at right coronary cusp.

Coronary Arteries:  Normal coronary origin.  Right dominance.

RCA is a large dominant artery that gives rise to PDA and PLA. There
is no plaque.

Left main is a large artery that gives rise to LAD and LCX arteries.

LAD is a large vessel that has no plaque.

LCX is a non-dominant artery that gives rise to one small OM1
branch. There is no plaque. Tortuous circumflex takeoff.

Small ramus branch.

Other findings:

Normal pulmonary vein drainage into the left atrium (normal variant
3 right pulmonary veins).

Normal left atrial appendage without a thrombus.

Normal size of the pulmonary artery.
IMPRESSION: 1. Coronary calcium score of 0. This was 0 percentile for age and
sex matched control.

2. Normal coronary origin with right dominance.

3. No evidence of CAD.

CAD-RADS 0. No evidence of CAD (0%). Consider non-atherosclerotic
causes of chest pain.

ADDENDUM:
Calcium score addendum:

Very mild mid LAD calcium.

Observed calcium score is 1 which is 55% for age match controls.

*** End of Addendum ***
Addendum:
EXAM:
OVER-READ INTERPRETATION  CT CHEST

The following report is an over-read performed by radiologist Dr.
Sonto Suzan [[email or web address]] on 05/29/2019. This over-read
does not include interpretation of cardiac or coronary anatomy or
pathology. The coronary CTA interpretation by the cardiologist is
attached.
FINDINGS: Vascular: Aortic atherosclerosis. No dissection or aneurysm. No
central pulmonary embolism, on this non-dedicated study.

Mediastinum/Nodes: A right hilar node is upper normal sized at 9 mm.
No imaged thoracic adenopathy.

Lungs/Pleura: No pleural fluid.  Clear imaged lungs.

Upper Abdomen: Possible mild hepatic steatosis. Normal imaged
portions of the stomach, spleen.

Musculoskeletal: No acute osseous abnormality.
IMPRESSION: 1.  No acute findings in the imaged extracardiac chest.
2.  Aortic Atherosclerosis (729VG-KZ3.3).
FINDINGS: A 100 kV prospective scan was triggered in the descending thoracic
aorta at 111 HU's. Axial non-contrast 3 mm slices were carried out
through the heart. The data set was analyzed on a dedicated work
station and scored using the Agatson method. Gantry rotation speed
was 250 msecs and collimation was .6 mm. 100mg metoprolol beta
blockade and 0.8 mg of sl NTG was given. The 3D data set was
reconstructed in 5% intervals of the 67-82 % of the R-R cycle.
Diastolic phases were analyzed on a dedicated work station using
MPR, MIP and VRT modes. The patient received 80 cc of contrast.

See radiology report for non cardiac findings.

Aorta:  Normal size.  No calcifications.  No dissection.

Aortic Valve: Trileaflet. Very small calcification of aortic valve
annulus at right coronary cusp.

Coronary Arteries:  Normal coronary origin.  Right dominance.

RCA is a large dominant artery that gives rise to PDA and PLA. There
is no plaque.

Left main is a large artery that gives rise to LAD and LCX arteries.

LAD is a large vessel that has no plaque.

LCX is a non-dominant artery that gives rise to one small OM1
branch. There is no plaque. Tortuous circumflex takeoff.

Small ramus branch.

Other findings:

Normal pulmonary vein drainage into the left atrium (normal variant
3 right pulmonary veins).

Normal left atrial appendage without a thrombus.

Normal size of the pulmonary artery.
IMPRESSION: 1. Coronary calcium score of 0. This was 0 percentile for age and
sex matched control.

2. Normal coronary origin with right dominance.

3. No evidence of CAD.

CAD-RADS 0. No evidence of CAD (0%). Consider non-atherosclerotic
causes of chest pain.

*** End of Addendum ***
EXAM:
OVER-READ INTERPRETATION  CT CHEST

The following report is an over-read performed by radiologist Dr.
Sonto Suzan [REDACTED] on 05/29/2019. This over-read
does not include interpretation of cardiac or coronary anatomy or
pathology. The coronary CTA interpretation by the cardiologist is
attached.
FINDINGS: Vascular: Aortic atherosclerosis. No dissection or aneurysm. No
central pulmonary embolism, on this non-dedicated study.

Mediastinum/Nodes: A right hilar node is upper normal sized at 9 mm.
No imaged thoracic adenopathy.

Lungs/Pleura: No pleural fluid.  Clear imaged lungs.

Upper Abdomen: Possible mild hepatic steatosis. Normal imaged
portions of the stomach, spleen.

Musculoskeletal: No acute osseous abnormality.
IMPRESSION: 1.  No acute findings in the imaged extracardiac chest.
2.  Aortic Atherosclerosis (729VG-KZ3.3).

## 2022-03-31 ENCOUNTER — Emergency Department (HOSPITAL_COMMUNITY): Payer: No Typology Code available for payment source

## 2022-03-31 ENCOUNTER — Encounter (HOSPITAL_COMMUNITY): Payer: Self-pay

## 2022-03-31 ENCOUNTER — Other Ambulatory Visit: Payer: Self-pay

## 2022-03-31 ENCOUNTER — Emergency Department (HOSPITAL_COMMUNITY)
Admission: EM | Admit: 2022-03-31 | Discharge: 2022-04-01 | Disposition: A | Discharge status: Home / Self Care | Payer: No Typology Code available for payment source | Attending: Emergency Medicine | Admitting: Emergency Medicine

## 2022-03-31 DIAGNOSIS — E1122 Type 2 diabetes mellitus with diabetic chronic kidney disease: Secondary | ICD-10-CM | POA: Insufficient documentation

## 2022-03-31 DIAGNOSIS — S8992XA Unspecified injury of left lower leg, initial encounter: Secondary | ICD-10-CM | POA: Diagnosis present

## 2022-03-31 DIAGNOSIS — S022XXA Fracture of nasal bones, initial encounter for closed fracture: Secondary | ICD-10-CM

## 2022-03-31 DIAGNOSIS — Y92410 Unspecified street and highway as the place of occurrence of the external cause: Secondary | ICD-10-CM | POA: Insufficient documentation

## 2022-03-31 DIAGNOSIS — R0789 Other chest pain: Secondary | ICD-10-CM | POA: Insufficient documentation

## 2022-03-31 DIAGNOSIS — K449 Diaphragmatic hernia without obstruction or gangrene: Secondary | ICD-10-CM

## 2022-03-31 DIAGNOSIS — M545 Low back pain, unspecified: Secondary | ICD-10-CM | POA: Insufficient documentation

## 2022-03-31 DIAGNOSIS — R519 Headache, unspecified: Secondary | ICD-10-CM | POA: Diagnosis not present

## 2022-03-31 DIAGNOSIS — I129 Hypertensive chronic kidney disease with stage 1 through stage 4 chronic kidney disease, or unspecified chronic kidney disease: Secondary | ICD-10-CM | POA: Diagnosis not present

## 2022-03-31 DIAGNOSIS — S8002XA Contusion of left knee, initial encounter: Secondary | ICD-10-CM | POA: Diagnosis not present

## 2022-03-31 DIAGNOSIS — J3489 Other specified disorders of nose and nasal sinuses: Secondary | ICD-10-CM | POA: Insufficient documentation

## 2022-03-31 DIAGNOSIS — N181 Chronic kidney disease, stage 1: Secondary | ICD-10-CM | POA: Diagnosis not present

## 2022-03-31 DIAGNOSIS — Z7984 Long term (current) use of oral hypoglycemic drugs: Secondary | ICD-10-CM | POA: Insufficient documentation

## 2022-03-31 DIAGNOSIS — Z79899 Other long term (current) drug therapy: Secondary | ICD-10-CM | POA: Diagnosis not present

## 2022-03-31 LAB — CBC
HCT: 36.1 % (ref 36.0–46.0)
Hemoglobin: 11.8 g/dL — ABNORMAL LOW (ref 12.0–15.0)
MCH: 29.9 pg (ref 26.0–34.0)
MCHC: 32.7 g/dL (ref 30.0–36.0)
MCV: 91.6 fL (ref 80.0–100.0)
Platelets: 236 10*3/uL (ref 150–400)
RBC: 3.94 MIL/uL (ref 3.87–5.11)
RDW: 13.1 % (ref 11.5–15.5)
WBC: 8.5 10*3/uL (ref 4.0–10.5)
nRBC: 0 % (ref 0.0–0.2)

## 2022-03-31 LAB — COMPREHENSIVE METABOLIC PANEL
ALT: 18 U/L (ref 0–44)
AST: 19 U/L (ref 15–41)
Albumin: 4.1 g/dL (ref 3.5–5.0)
Alkaline Phosphatase: 80 U/L (ref 38–126)
Anion gap: 9 (ref 5–15)
BUN: 28 mg/dL — ABNORMAL HIGH (ref 8–23)
CO2: 23 mmol/L (ref 22–32)
Calcium: 9.4 mg/dL (ref 8.9–10.3)
Chloride: 108 mmol/L (ref 98–111)
Creatinine, Ser: 0.96 mg/dL (ref 0.44–1.00)
GFR, Estimated: >60 mL/min (ref >60)
Glucose, Bld: 115 mg/dL — ABNORMAL HIGH (ref 70–99)
Potassium: 4.7 mmol/L (ref 3.5–5.1)
Sodium: 140 mmol/L (ref 135–145)
Total Bilirubin: 0.4 mg/dL (ref 0.3–1.2)
Total Protein: 8 g/dL (ref 6.5–8.1)

## 2022-03-31 LAB — PROTIME-INR
INR: 1 (ref 0.8–1.2)
Prothrombin Time: 12.9 seconds (ref 11.4–15.2)

## 2022-03-31 LAB — SAMPLE TO BLOOD BANK

## 2022-03-31 LAB — LACTIC ACID, PLASMA: Lactic Acid, Venous: 0.8 mmol/L (ref 0.5–1.9)

## 2022-03-31 MED ORDER — IOHEXOL 300 MG/ML  SOLN
100.0000 mL | Freq: Once | INTRAMUSCULAR | Status: AC | PRN
  Administered 2022-03-31: 100 mL via INTRAVENOUS

## 2022-03-31 MED ORDER — ACETAMINOPHEN 500 MG PO TABS: 1000.0000 mg | ORAL_TABLET | Freq: Once | ORAL | Status: DC

## 2022-03-31 MED ORDER — SODIUM CHLORIDE (PF) 0.9 % IJ SOLN
INTRAMUSCULAR | Status: AC
  Filled 2022-03-31: qty 50

## 2022-03-31 NOTE — ED Notes (Signed)
Please call Summer ( Pt's daughter) with update when available to do so. [phone]
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
## 2022-04-01 MED ORDER — KETOROLAC TROMETHAMINE 30 MG/ML IJ SOLN
30.0000 mg | Freq: Once | INTRAMUSCULAR | Status: AC
  Administered 2022-04-01: 30 mg via INTRAVENOUS
  Filled 2022-04-01: qty 1

## 2022-04-01 MED ORDER — NAPROXEN 375 MG PO TABS: 375.0000 mg | ORAL_TABLET | Freq: Two times a day (BID) | ORAL | 0 refills | Status: AC

## 2022-05-10 DIAGNOSIS — J069 Acute upper respiratory infection, unspecified: Secondary | ICD-10-CM | POA: Diagnosis not present

## 2022-05-17 DIAGNOSIS — S022XXD Fracture of nasal bones, subsequent encounter for fracture with routine healing: Secondary | ICD-10-CM | POA: Diagnosis not present

## 2022-06-26 DIAGNOSIS — K080 Exfoliation of teeth due to systemic causes: Secondary | ICD-10-CM | POA: Diagnosis not present

## 2022-07-11 DIAGNOSIS — E538 Deficiency of other specified B group vitamins: Secondary | ICD-10-CM | POA: Diagnosis not present

## 2023-02-27 DIAGNOSIS — E559 Vitamin D deficiency, unspecified: Secondary | ICD-10-CM | POA: Diagnosis not present

## 2023-02-27 DIAGNOSIS — Z0000 Encounter for general adult medical examination without abnormal findings: Secondary | ICD-10-CM | POA: Diagnosis not present

## 2023-02-27 DIAGNOSIS — N181 Chronic kidney disease, stage 1: Secondary | ICD-10-CM | POA: Diagnosis not present

## 2023-02-27 DIAGNOSIS — Z79899 Other long term (current) drug therapy: Secondary | ICD-10-CM | POA: Diagnosis not present

## 2023-02-27 DIAGNOSIS — Z23 Encounter for immunization: Secondary | ICD-10-CM | POA: Diagnosis not present

## 2023-02-27 DIAGNOSIS — E538 Deficiency of other specified B group vitamins: Secondary | ICD-10-CM | POA: Diagnosis not present

## 2023-02-27 DIAGNOSIS — E1122 Type 2 diabetes mellitus with diabetic chronic kidney disease: Secondary | ICD-10-CM | POA: Diagnosis not present

## 2023-02-27 DIAGNOSIS — Z124 Encounter for screening for malignant neoplasm of cervix: Secondary | ICD-10-CM | POA: Diagnosis not present

## 2023-02-27 DIAGNOSIS — E1121 Type 2 diabetes mellitus with diabetic nephropathy: Secondary | ICD-10-CM | POA: Diagnosis not present

## 2023-02-27 DIAGNOSIS — E785 Hyperlipidemia, unspecified: Secondary | ICD-10-CM | POA: Diagnosis not present

## 2023-02-27 DIAGNOSIS — I129 Hypertensive chronic kidney disease with stage 1 through stage 4 chronic kidney disease, or unspecified chronic kidney disease: Secondary | ICD-10-CM | POA: Diagnosis not present

## 2023-09-10 ENCOUNTER — Emergency Department (HOSPITAL_COMMUNITY): Payer: Worker's Compensation

## 2023-09-10 ENCOUNTER — Emergency Department (HOSPITAL_COMMUNITY)
Admission: EM | Admit: 2023-09-10 | Discharge: 2023-09-11 | Disposition: A | Discharge status: Home / Self Care | Payer: Worker's Compensation | Attending: Emergency Medicine | Admitting: Emergency Medicine

## 2023-09-10 ENCOUNTER — Encounter (HOSPITAL_COMMUNITY): Payer: Self-pay | Admitting: Emergency Medicine

## 2023-09-10 DIAGNOSIS — R519 Headache, unspecified: Secondary | ICD-10-CM | POA: Insufficient documentation

## 2023-09-10 DIAGNOSIS — M1990 Unspecified osteoarthritis, unspecified site: Secondary | ICD-10-CM

## 2023-09-10 DIAGNOSIS — W1809XS Striking against other object with subsequent fall, sequela: Secondary | ICD-10-CM | POA: Diagnosis not present

## 2023-09-10 DIAGNOSIS — I10 Essential (primary) hypertension: Secondary | ICD-10-CM | POA: Diagnosis not present

## 2023-09-10 DIAGNOSIS — S5002XS Contusion of left elbow, sequela: Secondary | ICD-10-CM | POA: Diagnosis not present

## 2023-09-10 DIAGNOSIS — R030 Elevated blood-pressure reading, without diagnosis of hypertension: Secondary | ICD-10-CM

## 2023-09-10 DIAGNOSIS — W19XXXS Unspecified fall, sequela: Secondary | ICD-10-CM

## 2023-09-10 DIAGNOSIS — R42 Dizziness and giddiness: Secondary | ICD-10-CM | POA: Insufficient documentation

## 2023-09-10 LAB — COMPREHENSIVE METABOLIC PANEL WITH GFR
ALT: 15 U/L (ref 0–44)
AST: 18 U/L (ref 15–41)
Albumin: 3.7 g/dL (ref 3.5–5.0)
Alkaline Phosphatase: 92 U/L (ref 38–126)
Anion gap: 10 (ref 5–15)
BUN: 26 mg/dL — ABNORMAL HIGH (ref 8–23)
CO2: 23 mmol/L (ref 22–32)
Calcium: 9.6 mg/dL (ref 8.9–10.3)
Chloride: 106 mmol/L (ref 98–111)
Creatinine, Ser: 0.9 mg/dL (ref 0.44–1.00)
GFR, Estimated: >60 mL/min (ref >60)
Glucose, Bld: 146 mg/dL — ABNORMAL HIGH (ref 70–99)
Potassium: 4.2 mmol/L (ref 3.5–5.1)
Sodium: 139 mmol/L (ref 135–145)
Total Bilirubin: 0.4 mg/dL (ref 0.0–1.2)
Total Protein: 7.4 g/dL (ref 6.5–8.1)

## 2023-09-10 LAB — CBC
HCT: 37.5 % (ref 36.0–46.0)
Hemoglobin: 12.2 g/dL (ref 12.0–15.0)
MCH: 29.8 pg (ref 26.0–34.0)
MCHC: 32.5 g/dL (ref 30.0–36.0)
MCV: 91.7 fL (ref 80.0–100.0)
Platelets: 240 10*3/uL (ref 150–400)
RBC: 4.09 MIL/uL (ref 3.87–5.11)
RDW: 13.8 % (ref 11.5–15.5)
WBC: 6.8 10*3/uL (ref 4.0–10.5)
nRBC: 0 % (ref 0.0–0.2)

## 2023-09-10 LAB — URINALYSIS, ROUTINE W REFLEX MICROSCOPIC
Bilirubin Urine: NEGATIVE
Glucose, UA: NEGATIVE mg/dL
Ketones, ur: NEGATIVE mg/dL
Leukocytes,Ua: NEGATIVE
Nitrite: NEGATIVE
Protein, ur: NEGATIVE mg/dL
Specific Gravity, Urine: 1.025 (ref 1.005–1.030)
pH: 6 (ref 5.0–8.0)

## 2023-09-10 LAB — URINALYSIS, MICROSCOPIC (REFLEX)

## 2023-09-10 NOTE — ED Provider Triage Note (Signed)
 Emergency Medicine Provider Triage Evaluation Note  Margaret Wong , a 68 y.o. female  was evaluated in triage.  Pt complains of fall. States that same occurred Friday when she was at work. Lost her balance and fell backwards on her buttocks. Didn't hit her head or loose consciousness. Is not anticoagulated.  She has been able to walk since then.  Has been having pain in her low back since but has not been evaluated for this yet.  Sunday, she started feeling lightheaded.  Notes she has a history of vertigo and this feels completely different.  She has a mild headache as well.  She went to urgent care today and was told to come here for evaluation.  Presents for same.  Reportedly, they were concerned at urgent care that her blood pressure was high and she has no diagnosis of hypertension and does not take medicine for high blood pressure.  Patient denies any chest pain or shortness of breath.  No nausea or vomiting.  No vision changes.  No loss of bowel or bladder function or saddle paresthesias.  She is also requesting an x-ray of her left elbow and her right knee as they are hurting her too.  She is able to walk and fully range her elbow without significant pain.  Review of Systems  Positive:  Negative:   Physical Exam  BP (!) 160/77   Pulse 73   Temp 98.1 F (36.7 C)   Resp 16   Ht 5\' 2"  (1.575 m)   Wt 68 kg   SpO2 98%   BMI 27.42 kg/m  Gen:   Awake, no distress   Resp:  Normal effort  MSK:   Moves extremities without difficulty  Other:    Medical Decision Making  Medically screening exam initiated at 6:10 PM.  Appropriate orders placed.  Witney Marcos was informed that the remainder of the evaluation will be completed by another provider, this initial triage assessment does not replace that evaluation, and the importance of remaining in the ED until their evaluation is complete.     Sherra Dk, PA-C 09/10/23 [phone]

## 2023-09-10 NOTE — ED Triage Notes (Signed)
 Pt sent from UC for HTN. Pt was at work and fell 2 days ago. Pt was helping her coworker and fell backwards onto back and buttocks. Endorses dizziness some since the fall. Endorses pain to sacrum, lumbar and thoracic tenderness, and left elbow pain. Mild headache.

## 2023-09-11 ENCOUNTER — Other Ambulatory Visit: Payer: Self-pay

## 2023-09-11 DIAGNOSIS — S5002XS Contusion of left elbow, sequela: Secondary | ICD-10-CM | POA: Diagnosis not present

## 2023-09-11 MED ORDER — LIDOCAINE 5 % EX PTCH: 1.0000 | MEDICATED_PATCH | CUTANEOUS | 0 refills | Status: DC

## 2023-09-11 MED ORDER — LIDOCAINE 5 % EX PTCH
3.0000 | MEDICATED_PATCH | CUTANEOUS | Status: DC
  Administered 2023-09-11: 3 via TRANSDERMAL
  Filled 2023-09-11: qty 3

## 2023-09-11 MED ORDER — ACETAMINOPHEN 500 MG PO TABS
1000.0000 mg | ORAL_TABLET | Freq: Once | ORAL | Status: AC
  Administered 2023-09-11: 1000 mg via ORAL
  Filled 2023-09-11: qty 2

## 2023-09-11 MED ORDER — LIDOCAINE 5 % EX PTCH: 1.0000 | MEDICATED_PATCH | CUTANEOUS | 0 refills | Status: AC
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
## 2023-09-11 NOTE — ED Provider Notes (Signed)
 [place] EMERGENCY DEPARTMENT AT [facility] Provider Note   CSN: [id number] Arrival date & time: 09/10/23  1719     History  Chief Complaint  Patient presents with   Hypertension   Fall    Margaret Wong is a 68 y.o. female.  The history is provided by the patient and a relative.  Fall This is a new problem. The current episode started more than 2 days ago (fell friday at work and struck head). Episode frequency: once. The problem has been resolved. Pertinent negatives include no chest pain, no abdominal pain and no shortness of breath. Associated symptoms comments: Had bruising to left elbow and ongoing pain and went to occupational medicine who sent the patient to the ED for imaging . Nothing aggravates the symptoms. Nothing relieves the symptoms. She has tried nothing for the symptoms. The treatment provided no relief.  Fall Friday at work sent by occupational medicine for imaging.  Blood pressure elevated there.       Home Medications Prior to Admission medications   Medication Sig Start Date End Date Taking? Authorizing Provider  acetaminophen  (TYLENOL ) 500 MG tablet Take 500 mg by mouth every 6 (six) hours as needed for moderate pain.    [provider]  atorvastatin  (LIPITOR) 20 MG tablet Take 20 mg by mouth every evening.    [provider]  lidocaine (LIDODERM) 5 % Place 1 patch onto the skin daily. Remove & Discard patch within 12 hours or as directed by MD 09/11/23   Maralee Senate, Biannca Scantlin, MD  losartan -hydrochlorothiazide (HYZAAR) 50-12.5 MG tablet Take 1 tablet by mouth daily. 03/10/19   Ross, Paula V, MD  meclizine (ANTIVERT) 25 MG tablet Take 25 mg by mouth 3 (three) times daily as needed for dizziness. 03/10/15   [provider]  metFORMIN (GLUCOPHAGE) 500 MG tablet Take 1,500 mg by mouth every evening.    [provider]  metoprolol  tartrate (LOPRESSOR ) 100 MG tablet Take one tablet 2 hours prior to cardiac ct Patient not  taking: Reported on 04/01/2022 03/06/19   Elmyra Haggard, MD  naproxen  (NAPROSYN ) 375 MG tablet Take 1 tablet (375 mg total) by mouth 2 (two) times daily with a meal. 04/01/22   Emberly Tomasso, MD  omeprazole (PRILOSEC) 40 MG capsule Take 40 mg by mouth daily as needed (indigestion). 01/28/15   [provider]      Allergies    Amoxicillin    Review of Systems   Review of Systems  Respiratory:  Negative for shortness of breath.   Cardiovascular:  Negative for chest pain.  Gastrointestinal:  Negative for abdominal pain and vomiting.  Musculoskeletal:  Positive for arthralgias.  All other systems reviewed and are negative.   Physical Exam Updated Vital Signs BP (!) 152/74 (BP Location: Left Arm)   Pulse 72   Temp 98.5 F (36.9 C)   Resp 17   Ht 5\' 2"  (1.575 m)   Wt 68 kg   SpO2 98%   BMI 27.42 kg/m  Physical Exam Vitals and nursing note reviewed.  Constitutional:      General: She is not in acute distress.    Appearance: She is well-developed.  HENT:     Head: Normocephalic and atraumatic.     Nose: Nose normal.  Eyes:     Extraocular Movements: Extraocular movements intact.  Cardiovascular:     Rate and Rhythm: Normal rate and regular rhythm.     Pulses: Normal pulses.     Heart sounds:  Normal heart sounds.  Pulmonary:     Effort: Pulmonary effort is normal. No respiratory distress.     Breath sounds: Normal breath sounds.  Abdominal:     General: Bowel sounds are normal. There is no distension.     Palpations: Abdomen is soft.     Tenderness: There is no abdominal tenderness. There is no guarding or rebound.  Musculoskeletal:        General: Normal range of motion.     Left elbow: No deformity, effusion or lacerations.     Right forearm: Normal.     Left forearm: Normal.     Right wrist: No snuff box tenderness or crepitus.     Left wrist: No snuff box tenderness or crepitus.       Arms:     Cervical back: Normal, normal range of motion and neck  supple.     Thoracic back: Normal.     Lumbar back: Normal.     Right hip: Normal.     Left hip: Normal.     Right knee: No LCL laxity, MCL laxity, ACL laxity or PCL laxity.     Left knee: No LCL laxity, MCL laxity, ACL laxity or PCL laxity.    Right ankle: Normal.     Right Achilles Tendon: Normal.     Left ankle: Normal.     Left Achilles Tendon: Normal.  Skin:    General: Skin is warm and dry.     Capillary Refill: Capillary refill takes less than 2 seconds.     Findings: No erythema or rash.  Neurological:     General: No focal deficit present.     Deep Tendon Reflexes: Reflexes normal.  Psychiatric:        Mood and Affect: Mood normal.     ED Results / Procedures / Treatments   Labs (all labs ordered are listed, but only abnormal results are displayed) Results for orders placed or performed during the hospital encounter of 09/10/23  Comprehensive metabolic panel   Collection Time: 09/10/23  6:09 PM  Result Value Ref Range   Sodium 139 135 - 145 mmol/L   Potassium 4.2 3.5 - 5.1 mmol/L   Chloride 106 98 - 111 mmol/L   CO2 23 22 - 32 mmol/L   Glucose, Bld 146 (H) 70 - 99 mg/dL   BUN 26 (H) 8 - 23 mg/dL   Creatinine, Ser 9.14 0.44 - 1.00 mg/dL   Calcium  9.6 8.9 - 10.3 mg/dL   Total Protein 7.4 6.5 - 8.1 g/dL   Albumin 3.7 3.5 - 5.0 g/dL   AST 18 15 - 41 U/L   ALT 15 0 - 44 U/L   Alkaline Phosphatase 92 38 - 126 U/L   Total Bilirubin 0.4 0.0 - 1.2 mg/dL   GFR, Estimated >78 >29 mL/min   Anion gap 10 5 - 15  CBC   Collection Time: 09/10/23  6:09 PM  Result Value Ref Range   WBC 6.8 4.0 - 10.5 K/uL   RBC 4.09 3.87 - 5.11 MIL/uL   Hemoglobin 12.2 12.0 - 15.0 g/dL   HCT 56.2 13.0 - 86.5 %   MCV 91.7 80.0 - 100.0 fL   MCH 29.8 26.0 - 34.0 pg   MCHC 32.5 30.0 - 36.0 g/dL   RDW 78.4 69.6 - 29.5 %   Platelets 240 150 - 400 K/uL   nRBC 0.0 0.0 - 0.2 %  Urinalysis, Routine w reflex microscopic -Urine, Clean Catch   Collection  Time: 09/10/23  6:52 PM  Result Value  Ref Range   Color, Urine YELLOW YELLOW   APPearance CLEAR CLEAR   Specific Gravity, Urine 1.025 1.005 - 1.030   pH 6.0 5.0 - 8.0   Glucose, UA NEGATIVE NEGATIVE mg/dL   Hgb urine dipstick TRACE (A) NEGATIVE   Bilirubin Urine NEGATIVE NEGATIVE   Ketones, ur NEGATIVE NEGATIVE mg/dL   Protein, ur NEGATIVE NEGATIVE mg/dL   Nitrite NEGATIVE NEGATIVE   Leukocytes,Ua NEGATIVE NEGATIVE  Urinalysis, Microscopic (reflex)   Collection Time: 09/10/23  6:52 PM  Result Value Ref Range   RBC / HPF 0-5 0 - 5 RBC/hpf   WBC, UA 0-5 0 - 5 WBC/hpf   Bacteria, UA RARE (A) NONE SEEN   Squamous Epithelial / HPF 0-5 0 - 5 /HPF   CT Head Wo Contrast Result Date: 09/10/2023 CLINICAL DATA:  Head trauma, hypertension, fall EXAM: CT HEAD WITHOUT CONTRAST TECHNIQUE: Contiguous axial images were obtained from the base of the skull through the vertex without intravenous contrast. RADIATION DOSE REDUCTION: This exam was performed according to the departmental dose-optimization program which includes automated exposure control, adjustment of the mA and/or kV according to patient size and/or use of iterative reconstruction technique. COMPARISON:  03/31/2022 FINDINGS: Brain: Stable atrophy pattern without acute intracranial hemorrhage, mass lesion, new infarction, midline shift, herniation, hydrocephalus, or extra-axial fluid collection. No focal mass effect or edema. Cisterns are patent. No cerebellar abnormality. Vascular: No hyperdense vessel or unexpected calcification. Skull: Normal. Negative for fracture or focal lesion. Sinuses/Orbits: No acute finding. Other: None. IMPRESSION: Stable atrophy pattern without acute intracranial abnormality by noncontrast CT. Electronically Signed   By: Melven Stable.  Shick M.D.   On: 09/10/2023 20:11   CT Lumbar Spine Wo Contrast Result Date: 09/10/2023 CLINICAL DATA:  Back trauma EXAM: CT LUMBAR SPINE WITHOUT CONTRAST TECHNIQUE: Multidetector CT imaging of the lumbar spine was performed without  intravenous contrast administration. Multiplanar CT image reconstructions were also generated. RADIATION DOSE REDUCTION: This exam was performed according to the departmental dose-optimization program which includes automated exposure control, adjustment of the mA and/or kV according to patient size and/or use of iterative reconstruction technique. COMPARISON:  CT chest abdomen and pelvis 04/01/2022. FINDINGS: Segmentation: 5 lumbar type vertebrae. Alignment: There is 3 mm retrolisthesis at L2-L3. Alignment is otherwise anatomic. Vertebrae: The bones are osteopenic. There is no acute fracture. No focal osseous lesion identified. Mild degenerative endplate changes are seen throughout. Paraspinal and other soft tissues: Negative. Disc levels: There is mild disc space narrowing at T12-L1 and L1-L2. Disc spaces are otherwise well maintained. T12-L1: No central canal or neural foraminal stenosis. L1-L2: Disc bulge, partially calcified. Mild central canal stenosis. No neural foraminal stenosis. L2-L3: Broad-based disc bulge. No central canal or neural foraminal stenosis. L3-L4: Mild broad-based disc bulge and mild facet arthropathy. No central canal or neural foraminal stenosis. L4-L5: Broad-based disc bulge eccentric to the right with bilateral facet arthropathy. No central canal or neural foraminal stenosis. L5-S1: Bilateral facet arthropathy. Broad-based disc bulge with left paracentral disc protrusion. No central canal or neural foraminal stenosis. IMPRESSION: 1. No acute fracture or traumatic subluxation of the lumbar spine. 2. Multilevel degenerative changes of the lumbar spine as described above. Electronically Signed   By: Tyron Gallon M.D.   On: 09/10/2023 20:06   DG Elbow Complete Left Result Date: 09/10/2023 CLINICAL DATA:  Fall EXAM: LEFT ELBOW - COMPLETE 3+ VIEW COMPARISON:  None Available. FINDINGS: There is no evidence of fracture, dislocation, or joint effusion.  There is no evidence of arthropathy or  other focal bone abnormality. Soft tissues are unremarkable. IMPRESSION: Negative. Electronically Signed   By: Tyron Gallon M.D.   On: 09/10/2023 19:06   DG Knee Complete 4 Views Right Result Date: 09/10/2023 CLINICAL DATA:  Fall EXAM: RIGHT KNEE - COMPLETE 4+ VIEW COMPARISON:  Right knee x-ray 08/20/2008 FINDINGS: No evidence of fracture, dislocation, or joint effusion. There is tricompartmental joint space narrowing and osteophyte formation most significant in the medial compartment compatible with moderate degenerative change. Soft tissues are unremarkable. IMPRESSION: 1. No acute fracture or dislocation. 2. Moderate degenerative change. Electronically Signed   By: Tyron Gallon M.D.   On: 09/10/2023 19:05     Radiology CT Head Wo Contrast Result Date: 09/10/2023 CLINICAL DATA:  Head trauma, hypertension, fall EXAM: CT HEAD WITHOUT CONTRAST TECHNIQUE: Contiguous axial images were obtained from the base of the skull through the vertex without intravenous contrast. RADIATION DOSE REDUCTION: This exam was performed according to the departmental dose-optimization program which includes automated exposure control, adjustment of the mA and/or kV according to patient size and/or use of iterative reconstruction technique. COMPARISON:  03/31/2022 FINDINGS: Brain: Stable atrophy pattern without acute intracranial hemorrhage, mass lesion, new infarction, midline shift, herniation, hydrocephalus, or extra-axial fluid collection. No focal mass effect or edema. Cisterns are patent. No cerebellar abnormality. Vascular: No hyperdense vessel or unexpected calcification. Skull: Normal. Negative for fracture or focal lesion. Sinuses/Orbits: No acute finding. Other: None. IMPRESSION: Stable atrophy pattern without acute intracranial abnormality by noncontrast CT. Electronically Signed   By: Melven Stable.  Shick M.D.   On: 09/10/2023 20:11   CT Lumbar Spine Wo Contrast Result Date: 09/10/2023 CLINICAL DATA:  Back trauma EXAM: CT  LUMBAR SPINE WITHOUT CONTRAST TECHNIQUE: Multidetector CT imaging of the lumbar spine was performed without intravenous contrast administration. Multiplanar CT image reconstructions were also generated. RADIATION DOSE REDUCTION: This exam was performed according to the departmental dose-optimization program which includes automated exposure control, adjustment of the mA and/or kV according to patient size and/or use of iterative reconstruction technique. COMPARISON:  CT chest abdomen and pelvis 04/01/2022. FINDINGS: Segmentation: 5 lumbar type vertebrae. Alignment: There is 3 mm retrolisthesis at L2-L3. Alignment is otherwise anatomic. Vertebrae: The bones are osteopenic. There is no acute fracture. No focal osseous lesion identified. Mild degenerative endplate changes are seen throughout. Paraspinal and other soft tissues: Negative. Disc levels: There is mild disc space narrowing at T12-L1 and L1-L2. Disc spaces are otherwise well maintained. T12-L1: No central canal or neural foraminal stenosis. L1-L2: Disc bulge, partially calcified. Mild central canal stenosis. No neural foraminal stenosis. L2-L3: Broad-based disc bulge. No central canal or neural foraminal stenosis. L3-L4: Mild broad-based disc bulge and mild facet arthropathy. No central canal or neural foraminal stenosis. L4-L5: Broad-based disc bulge eccentric to the right with bilateral facet arthropathy. No central canal or neural foraminal stenosis. L5-S1: Bilateral facet arthropathy. Broad-based disc bulge with left paracentral disc protrusion. No central canal or neural foraminal stenosis. IMPRESSION: 1. No acute fracture or traumatic subluxation of the lumbar spine. 2. Multilevel degenerative changes of the lumbar spine as described above. Electronically Signed   By: Tyron Gallon M.D.   On: 09/10/2023 20:06   DG Elbow Complete Left Result Date: 09/10/2023 CLINICAL DATA:  Fall EXAM: LEFT ELBOW - COMPLETE 3+ VIEW COMPARISON:  None Available. FINDINGS:  There is no evidence of fracture, dislocation, or joint effusion. There is no evidence of arthropathy or other focal bone abnormality. Soft tissues are unremarkable. IMPRESSION:  Negative. Electronically Signed   By: Tyron Gallon M.D.   On: 09/10/2023 19:06   DG Knee Complete 4 Views Right Result Date: 09/10/2023 CLINICAL DATA:  Fall EXAM: RIGHT KNEE - COMPLETE 4+ VIEW COMPARISON:  Right knee x-ray 08/20/2008 FINDINGS: No evidence of fracture, dislocation, or joint effusion. There is tricompartmental joint space narrowing and osteophyte formation most significant in the medial compartment compatible with moderate degenerative change. Soft tissues are unremarkable. IMPRESSION: 1. No acute fracture or dislocation. 2. Moderate degenerative change. Electronically Signed   By: Tyron Gallon M.D.   On: 09/10/2023 19:05    Procedures Procedures    Medications Ordered in ED Medications  lidocaine (LIDODERM) 5 % 3 patch (3 patches Transdermal Patch Applied 09/11/23 0330)  acetaminophen  (TYLENOL ) tablet 1,000 mg (1,000 mg Oral Given 09/11/23 0330)    ED Course/ Medical Decision Making/ A&P                                 Medical Decision Making Patient with fall at work on Friday, sent by occupational medicine for imaging   Amount and/or Complexity of Data Reviewed External Data Reviewed: notes.    Details: Previous notes reviewed  Labs: ordered.    Details: Urine is negative for UTI, normal white count 6.8, normal hemoglobin 12.2, normal platelet count.  Normal sodium 139, normal potassium 4.2, normal creatinine 0.9  Radiology: ordered and independent interpretation performed.    Details: Negative head CT  Risk OTC drugs. Prescription drug management. Risk Details: Well appearing, fall was on Friday.  Imaging is negative for acute findings, no traumatic injuries.  Follow up as directed with your occupational medicine panel.  BP slightly elevated in a patient with a history of hypertension. Based  on the [facility] 7 there is no indication for lowering at this level, may be stress vs. Pain related.  I have instructed close follow up with patient's regular doctor for ongoing care of her blood pressure. Alternate Tylenol  and ibuprofen for pain. Patient verbalizes understanding.  Stable for discharge with close follow up.      Final Clinical Impression(s) / ED Diagnoses Final diagnoses:  Fall, sequela  Elevated blood pressure reading   No signs of systemic illness or infection. The patient is nontoxic-appearing on exam and vital signs are within normal limits.  I have reviewed the triage vital signs and the nursing notes. Pertinent labs & imaging results that were available during my care of the patient were reviewed by me and considered in my medical decision making (see chart for details). After history, exam, and medical workup I feel the patient has been appropriately medically screened and is safe for discharge home. Pertinent diagnoses were discussed with the patient. Patient was given return precautions.      Rx / DC Orders ED Discharge Orders          Ordered    lidocaine (LIDODERM) 5 %  Every 24 hours,   Status:  Discontinued        09/11/23 0320    lidocaine (LIDODERM) 5 %  Every 24 hours        09/11/23 0342              Mariaha Ellington, MD 09/11/23 [id number]

## 2023-09-19 DIAGNOSIS — J069 Acute upper respiratory infection, unspecified: Secondary | ICD-10-CM | POA: Diagnosis not present

## 2023-10-07 DIAGNOSIS — E1121 Type 2 diabetes mellitus with diabetic nephropathy: Secondary | ICD-10-CM | POA: Diagnosis not present

## 2023-10-07 DIAGNOSIS — W19XXXD Unspecified fall, subsequent encounter: Secondary | ICD-10-CM | POA: Diagnosis not present

## 2023-10-07 DIAGNOSIS — I129 Hypertensive chronic kidney disease with stage 1 through stage 4 chronic kidney disease, or unspecified chronic kidney disease: Secondary | ICD-10-CM | POA: Diagnosis not present

## 2023-10-07 DIAGNOSIS — E785 Hyperlipidemia, unspecified: Secondary | ICD-10-CM | POA: Diagnosis not present

## 2023-12-30 DIAGNOSIS — M25562 Pain in left knee: Secondary | ICD-10-CM | POA: Diagnosis not present

## 2024-01-28 DIAGNOSIS — S8992XA Unspecified injury of left lower leg, initial encounter: Secondary | ICD-10-CM | POA: Diagnosis not present

## 2024-02-15 DIAGNOSIS — M25562 Pain in left knee: Secondary | ICD-10-CM | POA: Diagnosis not present

## 2024-02-25 DIAGNOSIS — S83232A Complex tear of medial meniscus, current injury, left knee, initial encounter: Secondary | ICD-10-CM | POA: Diagnosis not present

## 2024-02-25 DIAGNOSIS — S8992XD Unspecified injury of left lower leg, subsequent encounter: Secondary | ICD-10-CM | POA: Diagnosis not present

## 2024-03-24 DIAGNOSIS — M170 Bilateral primary osteoarthritis of knee: Secondary | ICD-10-CM | POA: Diagnosis not present

## 2024-03-24 DIAGNOSIS — S83242A Other tear of medial meniscus, current injury, left knee, initial encounter: Secondary | ICD-10-CM | POA: Diagnosis not present

## 2024-04-06 DIAGNOSIS — R059 Cough, unspecified: Secondary | ICD-10-CM | POA: Diagnosis not present

## 2024-04-06 DIAGNOSIS — J019 Acute sinusitis, unspecified: Secondary | ICD-10-CM | POA: Diagnosis not present

## 2024-04-13 DIAGNOSIS — E538 Deficiency of other specified B group vitamins: Secondary | ICD-10-CM | POA: Diagnosis not present

## 2024-04-13 DIAGNOSIS — Z0000 Encounter for general adult medical examination without abnormal findings: Secondary | ICD-10-CM | POA: Diagnosis not present

## 2024-04-13 DIAGNOSIS — E1121 Type 2 diabetes mellitus with diabetic nephropathy: Secondary | ICD-10-CM | POA: Diagnosis not present

## 2024-04-13 DIAGNOSIS — N181 Chronic kidney disease, stage 1: Secondary | ICD-10-CM | POA: Diagnosis not present

## 2024-04-13 DIAGNOSIS — Z79899 Other long term (current) drug therapy: Secondary | ICD-10-CM | POA: Diagnosis not present

## 2024-04-13 DIAGNOSIS — E559 Vitamin D deficiency, unspecified: Secondary | ICD-10-CM | POA: Diagnosis not present

## 2024-04-13 DIAGNOSIS — Z23 Encounter for immunization: Secondary | ICD-10-CM | POA: Diagnosis not present

## 2024-04-13 DIAGNOSIS — E785 Hyperlipidemia, unspecified: Secondary | ICD-10-CM | POA: Diagnosis not present

## 2024-04-13 DIAGNOSIS — I129 Hypertensive chronic kidney disease with stage 1 through stage 4 chronic kidney disease, or unspecified chronic kidney disease: Secondary | ICD-10-CM | POA: Diagnosis not present

## 2024-05-04 ENCOUNTER — Ambulatory Visit: Admit: 2024-05-04

## 2024-05-04 SURGERY — ARTHROSCOPY, KNEE, WITH MENISCUS REPAIR
Anesthesia: General | Site: Knee | Laterality: Left
